# Patient Record
Sex: Female | Born: 1940 | Race: White | Hispanic: No | Marital: Married | Smoking: Current some day smoker
Health system: Southern US, Community
[De-identification: ages and names within clinical notes are randomized; demographics above are authoritative.]

## PROBLEM LIST (undated history)

## (undated) DIAGNOSIS — C801 Malignant (primary) neoplasm, unspecified: Secondary | ICD-10-CM

## (undated) DIAGNOSIS — M1990 Unspecified osteoarthritis, unspecified site: Secondary | ICD-10-CM

## (undated) DIAGNOSIS — T7840XA Allergy, unspecified, initial encounter: Secondary | ICD-10-CM

## (undated) HISTORY — DX: Allergy, unspecified, initial encounter: T78.40XA

## (undated) HISTORY — DX: Malignant (primary) neoplasm, unspecified: C80.1

---

## 1983-10-08 DIAGNOSIS — C801 Malignant (primary) neoplasm, unspecified: Secondary | ICD-10-CM

## 1983-10-08 HISTORY — DX: Malignant (primary) neoplasm, unspecified: C80.1

## 2007-10-08 HISTORY — PX: INGUINAL HERNIA REPAIR: SUR1180

## 2010-10-07 HISTORY — PX: CATARACT EXTRACTION W/ INTRAOCULAR LENS  IMPLANT, BILATERAL: SHX1307

## 2010-12-28 ENCOUNTER — Ambulatory Visit: Payer: Self-pay | Admitting: Internal Medicine

## 2012-04-20 ENCOUNTER — Ambulatory Visit (AMBULATORY_SURGERY_CENTER): Payer: Medicare Other | Admitting: *Deleted

## 2012-04-20 VITALS — Ht 67.0 in | Wt 160.0 lb

## 2012-04-20 DIAGNOSIS — Z1211 Encounter for screening for malignant neoplasm of colon: Secondary | ICD-10-CM

## 2012-04-20 MED ORDER — MOVIPREP 100 G PO SOLR: ORAL | Status: DC

## 2012-04-28 ENCOUNTER — Encounter: Payer: Self-pay | Admitting: Internal Medicine

## 2012-04-28 ENCOUNTER — Ambulatory Visit (AMBULATORY_SURGERY_CENTER): Payer: Medicare Other | Admitting: Internal Medicine

## 2012-04-28 VITALS — BP 151/87 | HR 74 | Temp 96.2°F | Resp 27 | Ht 67.0 in | Wt 160.0 lb

## 2012-04-28 DIAGNOSIS — Z1211 Encounter for screening for malignant neoplasm of colon: Secondary | ICD-10-CM

## 2012-04-28 DIAGNOSIS — Z86010 Personal history of colonic polyps: Secondary | ICD-10-CM

## 2012-04-28 DIAGNOSIS — K5730 Diverticulosis of large intestine without perforation or abscess without bleeding: Secondary | ICD-10-CM

## 2012-04-28 MED ORDER — SODIUM CHLORIDE 0.9 % IV SOLN: 500.0000 mL | INTRAVENOUS | Status: DC

## 2012-04-28 NOTE — [facility] Notes (Signed)
Patient did not experience any of the following events: a burn prior to discharge; a fall within the facility; wrong site/side/patient/procedure/implant event; or a hospital transfer or hospital admission upon discharge from the facility. (G8907) Patient did not have preoperative order for IV antibiotic SSI prophylaxis. (G8918)  

## 2012-04-28 NOTE — [facility] Notes (Signed)
The pt tolerated the colonoscopy very well. Maw   

## 2012-04-28 NOTE — Patient Instructions (Addendum)
No polyps today! You do have diverticulosis. Please read the information provided.  Thank you for choosing me and [facility].  Iva Boop, MD, [facility]  Discharge instructions given with verbal understanding. Handouts on diverticulosis and a high fiber diet. Resume previous medications. YOU HAD AN ENDOSCOPIC PROCEDURE TODAY AT THE [facility]: Refer to the procedure report that was given to you for any specific questions about what was found during the examination.  If the procedure report does not answer your questions, please call your gastroenterologist to clarify.  If you requested that your care partner not be given the details of your procedure findings, then the procedure report has been included in a sealed envelope for you to review at your convenience later.  YOU SHOULD EXPECT: Some feelings of bloating in the abdomen. Passage of more gas than usual.  Walking can help get rid of the air that was put into your GI tract during the procedure and reduce the bloating. If you had a lower endoscopy (such as a colonoscopy or flexible sigmoidoscopy) you may notice spotting of blood in your stool or on the toilet paper. If you underwent a bowel prep for your procedure, then you may not have a normal bowel movement for a few days.  DIET: Your first meal following the procedure should be a light meal and then it is ok to progress to your normal diet.  A half-sandwich or bowl of soup is an example of a good first meal.  Heavy or fried foods are harder to digest and may make you feel nauseous or bloated.  Likewise meals heavy in dairy and vegetables can cause extra gas to form and this can also increase the bloating.  Drink plenty of fluids but you should avoid alcoholic beverages for 24 hours.  ACTIVITY: Your care partner should take you home directly after the procedure.  You should plan to take it easy, moving slowly for the rest of the day.  You can resume normal activity  the day after the procedure however you should NOT DRIVE or use heavy machinery for 24 hours (because of the sedation medicines used during the test).    SYMPTOMS TO REPORT IMMEDIATELY: A gastroenterologist can be reached at any hour.  During normal business hours, 8:30 AM to 5:00 PM Monday through Friday, call [phone].  After hours and on weekends, please call the GI answering service at [phone] who will take a message and have the physician on call contact you.   Following lower endoscopy (colonoscopy or flexible sigmoidoscopy):  Excessive amounts of blood in the stool  Significant tenderness or worsening of abdominal pains  Swelling of the abdomen that is new, acute  Fever of 100F or higher  FOLLOW UP: If any biopsies were taken you will be contacted by phone or by letter within the next 1-3 weeks.  Call your gastroenterologist if you have not heard about the biopsies in 3 weeks.  Our staff will call the home number listed on your records the next business day following your procedure to check on you and address any questions or concerns that you may have at that time regarding the information given to you following your procedure. This is a courtesy call and so if there is no answer at the home number and we have not heard from you through the emergency physician on call, we will assume that you have returned to your regular daily activities without incident.  SIGNATURES/CONFIDENTIALITY: You and/or your  care partner have signed paperwork which will be entered into your electronic medical record.  These signatures attest to the fact that that the information above on your After Visit Summary has been reviewed and is understood.  Full responsibility of the confidentiality of this discharge information lies with you and/or your care-partner.

## 2012-04-28 NOTE — Op Note (Signed)
[facility] [address] [place], [place]  [address]  COLONOSCOPY PROCEDURE REPORT  PATIENT:  Laurie, Drake  MR#:  [id number] BIRTHDATE:  25-Jun-1941, 70 yrs. old  GENDER:  female ENDOSCOPIST:  Iva Boop, MD, [facility]  PROCEDURE DATE:  04/28/2012 PROCEDURE:  Higher-risk screening colonoscopy G0105  ASA CLASS:  Class II INDICATIONS:  surveillance and high-risk screening, history of polyps rectal polyp destroyed 2001 MEDICATIONS:   These medications were titrated to patient response per physician's verbal order, MAC sedation, administered by CRNA, propofol (Diprivan) 400 mg IV  DESCRIPTION OF PROCEDURE:   After the risks benefits and alternatives of the procedure were thoroughly explained, informed consent was obtained.  Digital rectal exam was performed and revealed no abnormalities.   The LB [id number] endoscope was introduced through the anus and advanced to the cecum, which was identified by both the appendix and ileocecal valve, without limitations.  The quality of the prep was good, using MoviPrep. The instrument was then slowly withdrawn as the colon was fully examined. <<PROCEDUREIMAGES>>  FINDINGS:  Severe diverticulosis was found in the sigmoid colon. Mild diverticulosis was found in the right colon.  This was otherwise a normal examination of the colon. Includes right colon retroflexion.   Retroflexed views in the rectum revealed no abnormalities.    The time to cecum = 5:27 minutes. The scope was then withdrawn in 9:06 minutes from the cecum and the procedure completed. COMPLICATIONS:  None ENDOSCOPIC IMPRESSION: 1) Severe diverticulosis in the sigmoid colon 2) Mild diverticulosis in the right colon 3) Otherwise normal examination, good prep  REPEAT EXAM:  In 10 year(s) for routine screening colonoscopy.  Iva Boop, MD, [facility]  CC:  The Patient and J. Jethro Poling, MD  n. Rosalie Doctor:   Iva Boop at 04/28/2012 12:38 PM  Darel Hong, [phone]

## 2012-04-29 ENCOUNTER — Telehealth: Payer: Self-pay | Admitting: *Deleted

## 2012-04-29 NOTE — Telephone Encounter (Signed)
  Follow up Call-  Call back number 04/28/2012  Post procedure Call Back phone  # [phone]  Permission to leave phone message Yes    [facility]

## 2013-10-18 ENCOUNTER — Encounter (HOSPITAL_COMMUNITY): Payer: Self-pay | Admitting: *Deleted

## 2013-10-18 ENCOUNTER — Other Ambulatory Visit: Payer: Self-pay | Admitting: Orthopedic Surgery

## 2013-10-18 NOTE — [facility] Notes (Signed)
Left Orson Slick message regarding rash after laser surgery in 08/2013 with Dr Manning Charity.  No info from them without medical release.  Dr Baltazar Najjar ( Dermatology) in [facility]- rash that was cultured around Thanksgiving that was MRSA.  On 10/12/13 boil developed on back of leg has been on the generic for Bactrim since 10/12/13.  Have requested last office visit note form Dr Baltazar Najjar.

## 2013-10-18 NOTE — [facility] Notes (Signed)
Completed allergies, medications and history .  [place] in [place] for clarifications of medication dosages.  [phone].  Called office of Dr Manning Charity [phone].  [place] [facility] regarding laser surgery done and chest and development of rash. Patient will need to sign Medical Release Form.  Called office of Dr Baltazar Najjar in [facility] [phone]) for recent records of rash and culture.  Office to send via fax- last office visit note.

## 2013-10-19 ENCOUNTER — Other Ambulatory Visit: Payer: Self-pay | Admitting: Orthopedic Surgery

## 2013-10-19 NOTE — H&P (Signed)
[name] is an 73 y.o. female.   Chief Complaint:  Back and left leg pain HPI: The patient is a 73 year old female who presents today for follow up of their back. The patient is being followed for their left-sided back pain. They are now 1 week(s) out from when symptoms began. Symptoms reported today include: pain, weakness (left leg), numbness (left leg) and leg pain (left). The following medication has been used for pain control: Norco and Prednisone and Flexeril. The patient reports their current pain level to be severe. The patient presents today following MRI.  She is taking the Dosepak. She is still bound to a wheelchair. She still reports numbness down the L4 nerve root distribution, medial tibia, anterior thigh. Unable to stand with weakness.  Past Medical History  Diagnosis Date  . Allergy     cat and dog dander and seasonal  . Cancer 1985    melanoma on left foot  . Arthritis     Past Surgical History  Procedure Laterality Date  . Cataract extraction w/ intraocular lens  implant, bilateral  2012  . Inguinal hernia repair  2009    right    Family History  Problem Relation Age of Onset  . Colon cancer Mother 71  . Colon cancer Maternal Aunt   . Esophageal cancer Neg Hx   . Rectal cancer Neg Hx   . Stomach cancer Neg Hx    Social History:  reports that she has been smoking Cigarettes.  She has a 1 pack-year smoking history. She has never used smokeless tobacco. She reports that she drinks about 6.0 ounces of alcohol per week. She reports that she does not use illicit drugs.  Allergies: No Known Allergies   (Not in a hospital admission)  No results found for this or any previous visit (from the past 48 hour(s)). No results found.  Review of Systems  Constitutional: Negative.   HENT: Negative.   Eyes: Negative.   Respiratory: Negative.   Cardiovascular: Negative.   Gastrointestinal: Negative.   Genitourinary: Negative.   Musculoskeletal: Positive for  back pain.  Skin: Positive for rash.  Neurological: Positive for sensory change and focal weakness.  Psychiatric/Behavioral: Negative.     There were no vitals taken for this visit. Physical Exam  Constitutional: She is oriented to person, place, and time. She appears well-developed and well-nourished. She appears distressed.  HENT:  Head: Normocephalic and atraumatic.  Eyes: Conjunctivae and EOM are normal. Pupils are equal, round, and reactive to light.  Neck: Normal range of motion. Neck supple.  Cardiovascular: Normal rate and regular rhythm.   Respiratory: Effort normal and breath sounds normal.  GI: Soft. Bowel sounds are normal.  Musculoskeletal:  On exam today she is in a wheelchair. Moderate distress. Mood and affect are appropriate She has knee replacement on the left. Right decreased sensation L4 dermatome. Quad weakness is noted. Femoral stretch is markedly positive.  Neurological: She is alert and oriented to person, place, and time. She has normal reflexes.  Skin: Skin is warm and dry.  Psychiatric: She has a normal mood and affect.    MRI was reviewed. Large disc herniation extruded either from 3-4 or 4-5. It is behind the vertebral body at 4 compression the 4 root. There is a disc herniation at 4-5 into the lateral recess.  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  We discussed options. Given her  significant neurocompression lesion, significant disability, dermatomal dysesthesias, myotomal weakness it would be wise to consider decompression at 3-4 and 4-5. Capsular extruded fragment to evaluate 3-4.    I had an extensive discussion of the risks and benefits of lumbar decompression with the patient including bleeding, infection, damage to neurovascular structures, epidural fibrosis, CSF leakage requiring repair. We also discussed increase in  pain, adjacent segment disease, recurrent disc herniation, need for future surgery including repeat decompression and/or fusion. We also discussed risks of postoperative hematoma, paralysis, anesthetic complications including DVT, PE, death, cardiopulmonary dysfunction. In addition, the perioperative and postoperative courses were discussed in detail including the rehabilitative time and return to functional activity and work. I provided the patient with an illustrated handout and utilized the appropriate surgical models.  She is otherwise doing well. No chest, pain, or shortness or breath. Recently, however, she was treated back in November for MRSA infection. She was seen by a dermatologist. It was cultured. She was initially put on a different antibiotic. That was treated to resolution. We will obtain that culture.  A week or so ago she thought there was an area coming up in the back of her thigh. She was placed on Bactrim. It never erupted. It is not painful. There was no drainage. She was taking that still as a prophylactic measure. She was also given the mupirocin.  We stood her up from the wheelchair and looked at the back of her thigh. She has a healed lesion in the back of her thigh. It is not indurated. There is no drainage. I can press it without pain or sensitivity. I do not feel she has an active infection at this point in time. However, we do need to continue prophylaxis. I would use her mupirocin.  We will schedule this urgently in the operating room given the neurologic deficit. Also use preoperative Vancomycin and we may consider some postoperative MRSA prophylaxis as well. They understand. Lesions noted. Tobacco cessation is a requirement as well. Discussed this with her husband. We reviewed the MRI and the x rays in extensive detail. Again, she is wheelchair bound. We discussed the possibility that this may not recovery. She may have a permanent neurological  deficit. At this point, again, decompression will at least given the opportunity to recover.  Plan microlumbar decompression L3-4, L4-5  Lazaria Schaben M. for Dr. Tonita Cong 10/19/2013, 4:40 PM

## 2013-10-19 NOTE — [facility] Notes (Signed)
Called patient and obtained the exact physician she saw on 10/12/13 and where seen.  Patient was seen by Cecille Rubin Hvoxdovic at [facility] in [facility].  [phone].  Called the office and requested the last office visit note from 10/12/2013.  They are to send.  Spoke with Orson Slick at Dr Tonita Cong office and she stated Dr Tonita Cong and PA aware of hx of MRSA and that they were trying to obtain information from physician so they would know how to treat patient.  I made her aware of information I had from [facility] and faxed to her.

## 2013-10-19 NOTE — [facility] Notes (Signed)
Clarified soap received from pharmacy was hibiclens.  Patient verified over phone.

## 2013-10-19 NOTE — [facility] Notes (Signed)
Received from [facility] office visit note dated 10/12/13. Placed on chart and faxed to Orson Slick, surgery scheduled at office of Dr Tonita Cong.

## 2013-10-20 ENCOUNTER — Encounter (HOSPITAL_COMMUNITY): Payer: Medicare Other | Admitting: Anesthesiology

## 2013-10-20 ENCOUNTER — Encounter (HOSPITAL_COMMUNITY): Payer: Self-pay | Admitting: *Deleted

## 2013-10-20 ENCOUNTER — Ambulatory Visit (HOSPITAL_COMMUNITY): Payer: Medicare Other

## 2013-10-20 ENCOUNTER — Ambulatory Visit (HOSPITAL_COMMUNITY): Payer: Medicare Other | Admitting: Anesthesiology

## 2013-10-20 ENCOUNTER — Ambulatory Visit (HOSPITAL_COMMUNITY)
Admission: RE | Admit: 2013-10-20 | Discharge: 2013-10-21 | Disposition: A | Discharge status: Home / Self Care | Payer: Medicare Other | Source: Ambulatory Visit | Attending: Specialist | Admitting: Specialist

## 2013-10-20 ENCOUNTER — Encounter (HOSPITAL_COMMUNITY)
Admission: RE | Disposition: A | Discharge status: Home / Self Care | Payer: Self-pay | Source: Ambulatory Visit | Attending: Specialist

## 2013-10-20 DIAGNOSIS — M129 Arthropathy, unspecified: Secondary | ICD-10-CM | POA: Insufficient documentation

## 2013-10-20 DIAGNOSIS — M5126 Other intervertebral disc displacement, lumbar region: Secondary | ICD-10-CM | POA: Diagnosis present

## 2013-10-20 DIAGNOSIS — Z79899 Other long term (current) drug therapy: Secondary | ICD-10-CM | POA: Insufficient documentation

## 2013-10-20 DIAGNOSIS — M48062 Spinal stenosis, lumbar region with neurogenic claudication: Secondary | ICD-10-CM

## 2013-10-20 DIAGNOSIS — M5380 Other specified dorsopathies, site unspecified: Secondary | ICD-10-CM | POA: Insufficient documentation

## 2013-10-20 DIAGNOSIS — Z85820 Personal history of malignant melanoma of skin: Secondary | ICD-10-CM | POA: Insufficient documentation

## 2013-10-20 DIAGNOSIS — Z9109 Other allergy status, other than to drugs and biological substances: Secondary | ICD-10-CM | POA: Insufficient documentation

## 2013-10-20 DIAGNOSIS — Z96659 Presence of unspecified artificial knee joint: Secondary | ICD-10-CM | POA: Insufficient documentation

## 2013-10-20 DIAGNOSIS — F17200 Nicotine dependence, unspecified, uncomplicated: Secondary | ICD-10-CM | POA: Insufficient documentation

## 2013-10-20 HISTORY — DX: Unspecified osteoarthritis, unspecified site: M19.90

## 2013-10-20 HISTORY — PX: LUMBAR LAMINECTOMY/DECOMPRESSION MICRODISCECTOMY: SHX5026

## 2013-10-20 LAB — CBC
HCT: 39.4 % (ref 36.0–46.0)
Hemoglobin: 13.7 g/dL (ref 12.0–15.0)
MCH: 32.4 pg (ref 26.0–34.0)
MCHC: 34.8 g/dL (ref 30.0–36.0)
MCV: 93.1 fL (ref 78.0–100.0)
PLATELETS: 329 10*3/uL (ref 150–400)
RBC: 4.23 MIL/uL (ref 3.87–5.11)
RDW: 13 % (ref 11.5–15.5)
WBC: 12.6 10*3/uL — ABNORMAL HIGH (ref 4.0–10.5)

## 2013-10-20 LAB — BASIC METABOLIC PANEL
BUN: 18 mg/dL (ref 6–23)
CO2: 27 mEq/L (ref 19–32)
Calcium: 8.9 mg/dL (ref 8.4–10.5)
Chloride: 93 mEq/L — ABNORMAL LOW (ref 96–112)
Creatinine, Ser: 1 mg/dL (ref 0.50–1.10)
GFR calc Af Amer: 65 mL/min — ABNORMAL LOW (ref >90)
GFR, EST NON AFRICAN AMERICAN: 56 mL/min — AB (ref >90)
Glucose, Bld: 96 mg/dL (ref 70–99)
Potassium: 4.7 mEq/L (ref 3.7–5.3)
SODIUM: 130 meq/L — AB (ref 137–147)

## 2013-10-20 SURGERY — LUMBAR LAMINECTOMY/DECOMPRESSION MICRODISCECTOMY 2 LEVELS
Anesthesia: General | Site: Back

## 2013-10-20 MED ORDER — MENTHOL 3 MG MT LOZG
1.0000 | LOZENGE | OROMUCOSAL | Status: DC | PRN
  Filled 2013-10-20: qty 9

## 2013-10-20 MED ORDER — DOCUSATE SODIUM 100 MG PO CAPS: 100.0000 mg | ORAL_CAPSULE | Freq: Two times a day (BID) | ORAL | Status: AC

## 2013-10-20 MED ORDER — METHOCARBAMOL 500 MG PO TABS: 500.0000 mg | ORAL_TABLET | Freq: Four times a day (QID) | ORAL | Status: DC | PRN

## 2013-10-20 MED ORDER — CONJ ESTROG-MEDROXYPROGEST ACE 0.3-1.5 MG PO TABS: 1.0000 | ORAL_TABLET | Freq: Every day | ORAL | Status: DC

## 2013-10-20 MED ORDER — MIDAZOLAM HCL 5 MG/5ML IJ SOLN
INTRAMUSCULAR | Status: DC | PRN
  Administered 2013-10-20: 1 mg via INTRAVENOUS

## 2013-10-20 MED ORDER — OXYCODONE-ACETAMINOPHEN 5-325 MG PO TABS
1.0000 | ORAL_TABLET | ORAL | Status: DC | PRN
  Administered 2013-10-20: 2 via ORAL
  Filled 2013-10-20: qty 2

## 2013-10-20 MED ORDER — ACETAMINOPHEN 650 MG RE SUPP: 650.0000 mg | RECTAL | Status: DC | PRN

## 2013-10-20 MED ORDER — SUFENTANIL CITRATE 50 MCG/ML IV SOLN
INTRAVENOUS | Status: DC | PRN
  Administered 2013-10-20 (×2): 10 ug via INTRAVENOUS
  Administered 2013-10-20 (×2): 5 ug via INTRAVENOUS
  Administered 2013-10-20: 10 ug via INTRAVENOUS

## 2013-10-20 MED ORDER — LACTATED RINGERS IV SOLN: INTRAVENOUS | Status: DC

## 2013-10-20 MED ORDER — SUCCINYLCHOLINE CHLORIDE 20 MG/ML IJ SOLN
INTRAMUSCULAR | Status: DC | PRN
  Administered 2013-10-20: 50 mg via INTRAVENOUS

## 2013-10-20 MED ORDER — PROPOFOL 10 MG/ML IV BOLUS
INTRAVENOUS | Status: AC
  Filled 2013-10-20: qty 20

## 2013-10-20 MED ORDER — ACETAMINOPHEN 325 MG PO TABS: 650.0000 mg | ORAL_TABLET | ORAL | Status: DC | PRN

## 2013-10-20 MED ORDER — SODIUM CHLORIDE 0.9 % IJ SOLN
INTRAMUSCULAR | Status: AC
  Filled 2013-10-20: qty 10

## 2013-10-20 MED ORDER — VANCOMYCIN HCL IN DEXTROSE 1-5 GM/200ML-% IV SOLN
INTRAVENOUS | Status: AC
  Filled 2013-10-20: qty 200

## 2013-10-20 MED ORDER — ONDANSETRON HCL 4 MG/2ML IJ SOLN: 4.0000 mg | INTRAMUSCULAR | Status: DC | PRN

## 2013-10-20 MED ORDER — DOCUSATE SODIUM 100 MG PO CAPS
100.0000 mg | ORAL_CAPSULE | Freq: Two times a day (BID) | ORAL | Status: DC
  Administered 2013-10-20 – 2013-10-21 (×2): 100 mg via ORAL

## 2013-10-20 MED ORDER — THROMBIN 5000 UNITS EX SOLR
CUTANEOUS | Status: AC
  Filled 2013-10-20: qty 5000

## 2013-10-20 MED ORDER — SODIUM CHLORIDE 0.9 % IJ SOLN: 3.0000 mL | INTRAMUSCULAR | Status: DC | PRN

## 2013-10-20 MED ORDER — SODIUM CHLORIDE 0.9 % IJ SOLN
3.0000 mL | Freq: Two times a day (BID) | INTRAMUSCULAR | Status: DC
  Administered 2013-10-21: 3 mL via INTRAVENOUS

## 2013-10-20 MED ORDER — SODIUM CHLORIDE 0.9 % IR SOLN
Status: DC | PRN
  Administered 2013-10-20: 12:00:00

## 2013-10-20 MED ORDER — DEXAMETHASONE SODIUM PHOSPHATE 10 MG/ML IJ SOLN
INTRAMUSCULAR | Status: AC
  Filled 2013-10-20: qty 1

## 2013-10-20 MED ORDER — VANCOMYCIN HCL IN DEXTROSE 1-5 GM/200ML-% IV SOLN
1000.0000 mg | INTRAVENOUS | Status: AC
  Administered 2013-10-20: 1000 mg via INTRAVENOUS

## 2013-10-20 MED ORDER — KETAMINE HCL 10 MG/ML IJ SOLN
INTRAMUSCULAR | Status: AC
  Filled 2013-10-20: qty 1

## 2013-10-20 MED ORDER — HYDROMORPHONE HCL PF 1 MG/ML IJ SOLN
0.2500 mg | INTRAMUSCULAR | Status: DC | PRN
  Administered 2013-10-20: 0.5 mg via INTRAVENOUS

## 2013-10-20 MED ORDER — BUPIVACAINE-EPINEPHRINE (PF) 0.5% -1:200000 IJ SOLN
INTRAMUSCULAR | Status: DC | PRN
  Administered 2013-10-20: 4 mL

## 2013-10-20 MED ORDER — PHENOL 1.4 % MT LIQD: 1.0000 | OROMUCOSAL | Status: DC | PRN

## 2013-10-20 MED ORDER — KETAMINE HCL 10 MG/ML IJ SOLN
INTRAMUSCULAR | Status: DC | PRN
  Administered 2013-10-20: 20 mg via INTRAVENOUS
  Administered 2013-10-20 (×3): 10 mg via INTRAVENOUS

## 2013-10-20 MED ORDER — MUPIROCIN 2 % EX OINT
1.0000 "application " | TOPICAL_OINTMENT | Freq: Two times a day (BID) | CUTANEOUS | Status: DC
  Administered 2013-10-20 – 2013-10-21 (×2): 1 via NASAL
  Filled 2013-10-20: qty 22

## 2013-10-20 MED ORDER — HYDROMORPHONE HCL PF 1 MG/ML IJ SOLN
INTRAMUSCULAR | Status: AC
  Filled 2013-10-20: qty 1

## 2013-10-20 MED ORDER — CHLORHEXIDINE GLUCONATE 4 % EX LIQD: 60.0000 mL | Freq: Once | CUTANEOUS | Status: DC

## 2013-10-20 MED ORDER — MIDAZOLAM HCL 2 MG/2ML IJ SOLN
INTRAMUSCULAR | Status: AC
  Filled 2013-10-20: qty 2

## 2013-10-20 MED ORDER — SODIUM CHLORIDE 0.45 % IV SOLN
INTRAVENOUS | Status: DC
  Administered 2013-10-21: 05:00:00 via INTRAVENOUS

## 2013-10-20 MED ORDER — VANCOMYCIN HCL IN DEXTROSE 1-5 GM/200ML-% IV SOLN
1000.0000 mg | Freq: Two times a day (BID) | INTRAVENOUS | Status: AC
  Administered 2013-10-20 – 2013-10-21 (×2): 1000 mg via INTRAVENOUS
  Filled 2013-10-20 (×2): qty 200

## 2013-10-20 MED ORDER — CISATRACURIUM BESYLATE 20 MG/10ML IV SOLN
INTRAVENOUS | Status: AC
  Filled 2013-10-20: qty 10

## 2013-10-20 MED ORDER — METHOCARBAMOL 500 MG PO TABS: 500.0000 mg | ORAL_TABLET | Freq: Three times a day (TID) | ORAL | Status: AC

## 2013-10-20 MED ORDER — BUPIVACAINE-EPINEPHRINE PF 0.5-1:200000 % IJ SOLN
INTRAMUSCULAR | Status: AC
  Filled 2013-10-20: qty 30

## 2013-10-20 MED ORDER — PROPOFOL 10 MG/ML IV BOLUS
INTRAVENOUS | Status: DC | PRN
  Administered 2013-10-20: 80 mg via INTRAVENOUS

## 2013-10-20 MED ORDER — LIDOCAINE HCL (CARDIAC) 20 MG/ML IV SOLN
INTRAVENOUS | Status: AC
  Filled 2013-10-20: qty 5

## 2013-10-20 MED ORDER — CHLORHEXIDINE GLUCONATE CLOTH 2 % EX PADS: 6.0000 | MEDICATED_PAD | Freq: Once | CUTANEOUS | Status: DC

## 2013-10-20 MED ORDER — CISATRACURIUM BESYLATE (PF) 10 MG/5ML IV SOLN
INTRAVENOUS | Status: DC | PRN
  Administered 2013-10-20: 2 mg via INTRAVENOUS
  Administered 2013-10-20: 6 mg via INTRAVENOUS

## 2013-10-20 MED ORDER — NEOSTIGMINE METHYLSULFATE 1 MG/ML IJ SOLN
INTRAMUSCULAR | Status: AC
  Filled 2013-10-20: qty 10

## 2013-10-20 MED ORDER — LIDOCAINE HCL (CARDIAC) 20 MG/ML IV SOLN
INTRAVENOUS | Status: DC | PRN
  Administered 2013-10-20: 50 mg via INTRAVENOUS

## 2013-10-20 MED ORDER — EPHEDRINE SULFATE 50 MG/ML IJ SOLN
INTRAMUSCULAR | Status: DC | PRN
  Administered 2013-10-20 (×3): 10 mg via INTRAVENOUS

## 2013-10-20 MED ORDER — HYDROCODONE-ACETAMINOPHEN 7.5-325 MG PO TABS: 1.0000 | ORAL_TABLET | ORAL | Status: AC | PRN

## 2013-10-20 MED ORDER — HYDROMORPHONE HCL PF 1 MG/ML IJ SOLN: 0.5000 mg | INTRAMUSCULAR | Status: DC | PRN

## 2013-10-20 MED ORDER — LACTATED RINGERS IV SOLN
INTRAVENOUS | Status: DC
  Administered 2013-10-20: 1000 mL via INTRAVENOUS
  Administered 2013-10-20: 12:00:00 via INTRAVENOUS

## 2013-10-20 MED ORDER — ONDANSETRON HCL 4 MG/2ML IJ SOLN
INTRAMUSCULAR | Status: AC
  Filled 2013-10-20: qty 2

## 2013-10-20 MED ORDER — THROMBIN 5000 UNITS EX SOLR
CUTANEOUS | Status: DC | PRN
  Administered 2013-10-20 (×2): 5000 [IU] via TOPICAL

## 2013-10-20 MED ORDER — METHOCARBAMOL 100 MG/ML IJ SOLN
500.0000 mg | Freq: Four times a day (QID) | INTRAVENOUS | Status: DC | PRN
  Administered 2013-10-20: 19:00:00 500 mg via INTRAVENOUS
  Filled 2013-10-20: qty 5

## 2013-10-20 MED ORDER — NEOSTIGMINE METHYLSULFATE 1 MG/ML IJ SOLN
INTRAMUSCULAR | Status: DC | PRN
  Administered 2013-10-20: 4 mg via INTRAVENOUS

## 2013-10-20 MED ORDER — SUFENTANIL CITRATE 50 MCG/ML IV SOLN
INTRAVENOUS | Status: AC
  Filled 2013-10-20: qty 1

## 2013-10-20 MED ORDER — SULFAMETHOXAZOLE-TRIMETHOPRIM 400-80 MG PO TABS
1.0000 | ORAL_TABLET | Freq: Two times a day (BID) | ORAL | Status: DC
Start: 2013-10-20 — End: 2013-10-21
  Administered 2013-10-20 – 2013-10-21 (×2): 1 via ORAL
  Filled 2013-10-20 (×4): qty 1

## 2013-10-20 MED ORDER — HYDROCODONE-ACETAMINOPHEN 5-325 MG PO TABS
1.0000 | ORAL_TABLET | ORAL | Status: DC | PRN
  Administered 2013-10-20 – 2013-10-21 (×3): 2 via ORAL
  Filled 2013-10-20 (×3): qty 2

## 2013-10-20 MED ORDER — SODIUM CHLORIDE 0.9 % IV SOLN: 250.0000 mL | INTRAVENOUS | Status: DC

## 2013-10-20 MED ORDER — GLYCOPYRROLATE 0.2 MG/ML IJ SOLN
INTRAMUSCULAR | Status: DC | PRN
  Administered 2013-10-20: .6 mg via INTRAVENOUS

## 2013-10-20 MED ORDER — ONDANSETRON HCL 4 MG/2ML IJ SOLN
INTRAMUSCULAR | Status: DC | PRN
  Administered 2013-10-20: 4 mg via INTRAVENOUS

## 2013-10-20 MED ORDER — EPHEDRINE SULFATE 50 MG/ML IJ SOLN
INTRAMUSCULAR | Status: AC
  Filled 2013-10-20: qty 1

## 2013-10-20 MED ORDER — GLYCOPYRROLATE 0.2 MG/ML IJ SOLN
INTRAMUSCULAR | Status: AC
  Filled 2013-10-20: qty 3

## 2013-10-20 MED ORDER — DEXAMETHASONE SODIUM PHOSPHATE 10 MG/ML IJ SOLN
INTRAMUSCULAR | Status: DC | PRN
  Administered 2013-10-20: 10 mg via INTRAVENOUS

## 2013-10-20 SURGICAL SUPPLY — 46 items
BAG ZIPLOCK 12X15 (MISCELLANEOUS) ×3 IMPLANT
BENZOIN TINCTURE PRP APPL 2/3 (GAUZE/BANDAGES/DRESSINGS) ×3 IMPLANT
CHLORAPREP W/TINT 26ML (MISCELLANEOUS) IMPLANT
CLEANER TIP ELECTROSURG 2X2 (MISCELLANEOUS) ×3 IMPLANT
CLOSURE WOUND 1/2 X4 (GAUZE/BANDAGES/DRESSINGS) ×1
CLOTH 2% CHLOROHEXIDINE 3PK (PERSONAL CARE ITEMS) ×3 IMPLANT
DECANTER SPIKE VIAL GLASS SM (MISCELLANEOUS) ×3 IMPLANT
DRAPE MICROSCOPE LEICA (MISCELLANEOUS) ×3 IMPLANT
DRAPE POUCH INSTRU U-SHP 10X18 (DRAPES) ×3 IMPLANT
DRAPE SURG 17X11 SM STRL (DRAPES) ×3 IMPLANT
DRAPE UTILITY XL STRL (DRAPES) ×3 IMPLANT
DRSG AQUACEL AG ADV 3.5X 4 (GAUZE/BANDAGES/DRESSINGS) IMPLANT
DRSG AQUACEL AG ADV 3.5X 6 (GAUZE/BANDAGES/DRESSINGS) ×3 IMPLANT
DURAPREP 26ML APPLICATOR (WOUND CARE) ×3 IMPLANT
DURASEAL SPINE SEALANT 3ML (MISCELLANEOUS) ×3 IMPLANT
ELECT REM PT RETURN 9FT ADLT (ELECTROSURGICAL) ×3
ELECTRODE REM PT RTRN 9FT ADLT (ELECTROSURGICAL) ×1 IMPLANT
GLOVE BIOGEL PI IND STRL 7.5 (GLOVE) ×1 IMPLANT
GLOVE BIOGEL PI INDICATOR 7.5 (GLOVE) ×2
GLOVE SURG SS PI 7.5 STRL IVOR (GLOVE) ×3 IMPLANT
GLOVE SURG SS PI 8.0 STRL IVOR (GLOVE) ×6 IMPLANT
GOWN STRL REUS W/TWL XL LVL3 (GOWN DISPOSABLE) ×6 IMPLANT
IV CATH 14GX2 1/4 (CATHETERS) ×3 IMPLANT
KIT BASIN OR (CUSTOM PROCEDURE TRAY) ×3 IMPLANT
KIT POSITIONING SURG ANDREWS (MISCELLANEOUS) ×3 IMPLANT
MANIFOLD NEPTUNE II (INSTRUMENTS) ×3 IMPLANT
NEEDLE SPNL 18GX3.5 QUINCKE PK (NEEDLE) ×9 IMPLANT
PATTIES SURGICAL .5 X.5 (GAUZE/BANDAGES/DRESSINGS) IMPLANT
PATTIES SURGICAL .75X.75 (GAUZE/BANDAGES/DRESSINGS) IMPLANT
PATTIES SURGICAL 1X1 (DISPOSABLE) IMPLANT
SPONGE SURGIFOAM ABS GEL 100 (HEMOSTASIS) ×3 IMPLANT
STRIP CLOSURE SKIN 1/2X4 (GAUZE/BANDAGES/DRESSINGS) ×2 IMPLANT
SUT NURALON 4 0 TR CR/8 (SUTURE) ×3 IMPLANT
SUT PROLENE 3 0 PS 2 (SUTURE) ×3 IMPLANT
SUT VIC AB 1 CT1 27 (SUTURE)
SUT VIC AB 1 CT1 27XBRD ANTBC (SUTURE) IMPLANT
SUT VIC AB 1-0 CT2 27 (SUTURE) IMPLANT
SUT VIC AB 2-0 CT1 27 (SUTURE)
SUT VIC AB 2-0 CT1 TAPERPNT 27 (SUTURE) IMPLANT
SUT VIC AB 2-0 CT2 27 (SUTURE) IMPLANT
SYRINGE 10CC LL (SYRINGE) ×3 IMPLANT
TOWEL OR 17X26 10 PK STRL BLUE (TOWEL DISPOSABLE) ×3 IMPLANT
TOWEL OR NON WOVEN STRL DISP B (DISPOSABLE) ×3 IMPLANT
TRAY FOLEY CATH 14FRSI W/METER (CATHETERS) ×3 IMPLANT
TRAY LAMINECTOMY (CUSTOM PROCEDURE TRAY) ×3 IMPLANT
YANKAUER SUCT BULB TIP NO VENT (SUCTIONS) ×3 IMPLANT

## 2013-10-20 NOTE — Discharge Instructions (Signed)
Walk As Tolerated utilizing back precautions.  No bending, twisting, or lifting.  No driving for 2 weeks.   °Aquacel dressing may remain in place for 7 days. May shower with aquacel dressing in place. After 7 days, remove aquacel dressing and place gauze and tape dressing which should be kept clean and dry and changed daily. °See Dr. Beane in office in 10 to 14 days. Begin taking aspirin 81mg per day starting 4 days after your surgery if not allergic to aspirin or on another blood thinner. °Walk daily even outside. Use a cane or walker only if necessary. °Avoid sitting on soft sofas. ° °

## 2013-10-20 NOTE — Brief Op Note (Signed)
10/20/2013  12:39 PM  PATIENT:  Derrick Ravel  73 y.o. female  PRE-OPERATIVE DIAGNOSIS:  HNP L3-4  POST-OPERATIVE DIAGNOSIS:  HNP L3-4  PROCEDURE:  Procedure(s): MICROLUMBAR DECOMPRESSION MICRODISCECTOMY L3-4,L4-5 (N/A)  SURGEON:  Surgeon(s) and Role:    * Johnn Hai, MD - Primary  PHYSICIAN ASSISTANT:   ASSISTANTS: Bissell   ANESTHESIA:   general  EBL:  Total I/O In: 1000 [I.V.:1000] Out: 500 [Urine:450; Blood:50]  BLOOD ADMINISTERED:none  DRAINS: none   LOCAL MEDICATIONS USED:  MARCAINE     SPECIMEN:  No Specimen  DISPOSITION OF SPECIMEN:  N/A  COUNTS:  YES  TOURNIQUET:  * No tourniquets in log *  DICTATION: .Other Dictation: Dictation Number [id number]  PLAN OF CARE: Admit for overnight observation  PATIENT DISPOSITION:  PACU - hemodynamically stable.   Delay start of Pharmacological VTE agent (>24hrs) due to surgical blood loss or risk of bleeding: yes

## 2013-10-20 NOTE — Plan of Care (Signed)
Problem: Consults Goal: Diagnosis - Spinal Surgery Lumbar Laminectomy (Complex)     

## 2013-10-20 NOTE — Anesthesia Preprocedure Evaluation (Addendum)
Anesthesia Evaluation  Patient identified by MRN, date of birth, ID band Patient awake    Reviewed: Allergy & Precautions, H&P , NPO status , Patient's Chart, lab work & pertinent test results  Airway Mallampati: II TM Distance: >3 FB Neck ROM: full    Dental  (+) Caps and Dental Advisory Given Cap left upper front:   Pulmonary neg pulmonary ROS, Current Smoker,  breath sounds clear to auscultation  Pulmonary exam normal       Cardiovascular Exercise Tolerance: Good negative cardio ROS  Rhythm:regular Rate:Normal     Neuro/Psych negative neurological ROS  negative psych ROS   GI/Hepatic negative GI ROS, Neg liver ROS,   Endo/Other  negative endocrine ROS  Renal/GU negative Renal ROS  negative genitourinary   Musculoskeletal   Abdominal   Peds  Hematology negative hematology ROS (+)   Anesthesia Other Findings   Reproductive/Obstetrics negative OB ROS                          Anesthesia Physical Anesthesia Plan  ASA: II  Anesthesia Plan: General   Post-op Pain Management:    Induction: Intravenous  Airway Management Planned: Oral ETT  Additional Equipment:   Intra-op Plan:   Post-operative Plan: Extubation in OR  Informed Consent: I have reviewed the patients History and Physical, chart, labs and discussed the procedure including the risks, benefits and alternatives for the proposed anesthesia with the patient or authorized representative who has indicated his/her understanding and acceptance.   Dental Advisory Given  Plan Discussed with: CRNA and Surgeon  Anesthesia Plan Comments:         Anesthesia Quick Evaluation

## 2013-10-20 NOTE — Transfer of Care (Signed)
Immediate Anesthesia Transfer of Care Note  Patient: [name]  Procedure(s) Performed: Procedure(s): MICROLUMBAR DECOMPRESSION MICRODISCECTOMY L3-4,L4-5 (N/A)  Patient Location: PACU  Anesthesia Type:General  Level of Consciousness: awake and oriented  Airway & Oxygen Therapy: Patient Spontanous Breathing and Patient connected to face mask oxygen  Post-op Assessment: Report given to PACU RN and Post -op Vital signs reviewed and stable  Post vital signs: Reviewed and stable  Complications: No apparent anesthesia complications

## 2013-10-20 NOTE — Preoperative (Signed)
Beta Blockers   Reason not to administer Beta Blockers:Not Applicable 

## 2013-10-20 NOTE — Anesthesia Procedure Notes (Signed)
Procedure Name: Intubation Date/Time: 10/20/2013 11:07 AM Performed by: Danley Danker L Patient Re-evaluated:Patient Re-evaluated prior to inductionOxygen Delivery Method: Circle system utilized Preoxygenation: Pre-oxygenation with 100% oxygen Intubation Type: IV induction Ventilation: Mask ventilation without difficulty and Oral airway inserted - appropriate to patient size Laryngoscope Size: Sabra Heck and 2 Grade View: Grade II Tube type: Oral Tube size: 7.5 mm Number of attempts: 1 Airway Equipment and Method: Stylet Placement Confirmation: ETT inserted through vocal cords under direct vision,  breath sounds checked- equal and bilateral and positive ETCO2 Secured at: 21 cm Tube secured with: Tape Dental Injury: Teeth and Oropharynx as per pre-operative assessment

## 2013-10-20 NOTE — [facility] Notes (Signed)
Patient states that Dr. Tonita Cong is aware that she has had a recent boil on her leg which was positive for MRSA. He instructed her to use Mupirocin ointment.  Mickel Baas RN with infection control was notified that patient has MRSA and was placed on isolation.

## 2013-10-20 NOTE — Interval H&P Note (Signed)
History and Physical Interval Note:  10/20/2013 8:33 AM  [name]  has presented today for surgery, with the diagnosis of HNP L3-4  The various methods of treatment have been discussed with the patient and family. After consideration of risks, benefits and other options for treatment, the patient has consented to  Procedure(s): MICROLUMBAR DECOMPRESSION MICRODISCECTOMY L3-4,L4-5 (N/A) as a surgical intervention .  The patient's history has been reviewed, patient examined, no change in status, stable for surgery.  I have reviewed the patient's chart and labs.  Questions were answered to the patient's satisfaction.     Chloee Tena C

## 2013-10-20 NOTE — H&P (View-Only) (Signed)
[name] is an 72 y.o. female.   Chief Complaint:  Back and left leg pain HPI: The patient is a 72 year old female who presents today for follow up of their back. The patient is being followed for their left-sided back pain. They are now 1 week(s) out from when symptoms began. Symptoms reported today include: pain, weakness (left leg), numbness (left leg) and leg pain (left). The following medication has been used for pain control: Norco and Prednisone and Flexeril. The patient reports their current pain level to be severe. The patient presents today following MRI.  She is taking the Dosepak. She is still bound to a wheelchair. She still reports numbness down the L4 nerve root distribution, medial tibia, anterior thigh. Unable to stand with weakness.  Past Medical History  Diagnosis Date  . Allergy     cat and dog dander and seasonal  . Cancer 1985    melanoma on left foot  . Arthritis     Past Surgical History  Procedure Laterality Date  . Cataract extraction w/ intraocular lens  implant, bilateral  2012  . Inguinal hernia repair  2009    right    Family History  Problem Relation Age of Onset  . Colon cancer Mother 80  . Colon cancer Maternal Aunt   . Esophageal cancer Neg Hx   . Rectal cancer Neg Hx   . Stomach cancer Neg Hx    Social History:  reports that she has been smoking Cigarettes.  She has a 1 pack-year smoking history. She has never used smokeless tobacco. She reports that she drinks about 6.0 ounces of alcohol per week. She reports that she does not use illicit drugs.  Allergies: No Known Allergies   (Not in a hospital admission)  No results found for this or any previous visit (from the past 48 hour(s)). No results found.  Review of Systems  Constitutional: Negative.   HENT: Negative.   Eyes: Negative.   Respiratory: Negative.   Cardiovascular: Negative.   Gastrointestinal: Negative.   Genitourinary: Negative.   Musculoskeletal: Positive for  back pain.  Skin: Positive for rash.  Neurological: Positive for sensory change and focal weakness.  Psychiatric/Behavioral: Negative.     There were no vitals taken for this visit. Physical Exam  Constitutional: She is oriented to person, place, and time. She appears well-developed and well-nourished. She appears distressed.  HENT:  Head: Normocephalic and atraumatic.  Eyes: Conjunctivae and EOM are normal. Pupils are equal, round, and reactive to light.  Neck: Normal range of motion. Neck supple.  Cardiovascular: Normal rate and regular rhythm.   Respiratory: Effort normal and breath sounds normal.  GI: Soft. Bowel sounds are normal.  Musculoskeletal:  On exam today she is in a wheelchair. Moderate distress. Mood and affect are appropriate She has knee replacement on the left. Right decreased sensation L4 dermatome. Quad weakness is noted. Femoral stretch is markedly positive.  Neurological: She is alert and oriented to person, place, and time. She has normal reflexes.  Skin: Skin is warm and dry.  Psychiatric: She has a normal mood and affect.    MRI was reviewed. Large disc herniation extruded either from 3-4 or 4-5. It is behind the vertebral body at 4 compression the 4 root. There is a disc herniation at 4-5 into the lateral recess.  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  We discussed options. Given her   significant neurocompression lesion, significant disability, dermatomal dysesthesias, myotomal weakness it would be wise to consider decompression at 3-4 and 4-5. Capsular extruded fragment to evaluate 3-4.    I had an extensive discussion of the risks and benefits of lumbar decompression with the patient including bleeding, infection, damage to neurovascular structures, epidural fibrosis, CSF leakage requiring repair. We also discussed increase in  pain, adjacent segment disease, recurrent disc herniation, need for future surgery including repeat decompression and/or fusion. We also discussed risks of postoperative hematoma, paralysis, anesthetic complications including DVT, PE, death, cardiopulmonary dysfunction. In addition, the perioperative and postoperative courses were discussed in detail including the rehabilitative time and return to functional activity and work. I provided the patient with an illustrated handout and utilized the appropriate surgical models.  She is otherwise doing well. No chest, pain, or shortness or breath. Recently, however, she was treated back in November for MRSA infection. She was seen by a dermatologist. It was cultured. She was initially put on a different antibiotic. That was treated to resolution. We will obtain that culture.  A week or so ago she thought there was an area coming up in the back of her thigh. She was placed on Bactrim. It never erupted. It is not painful. There was no drainage. She was taking that still as a prophylactic measure. She was also given the mupirocin.  We stood her up from the wheelchair and looked at the back of her thigh. She has a healed lesion in the back of her thigh. It is not indurated. There is no drainage. I can press it without pain or sensitivity. I do not feel she has an active infection at this point in time. However, we do need to continue prophylaxis. I would use her mupirocin.  We will schedule this urgently in the operating room given the neurologic deficit. Also use preoperative Vancomycin and we may consider some postoperative MRSA prophylaxis as well. They understand. Lesions noted. Tobacco cessation is a requirement as well. Discussed this with her husband. We reviewed the MRI and the x rays in extensive detail. Again, she is wheelchair bound. We discussed the possibility that this may not recovery. She may have a permanent neurological  deficit. At this point, again, decompression will at least given the opportunity to recover.  Plan microlumbar decompression L3-4, L4-5  BISSELL, JACLYN M. for Dr. Tonita Cong 10/19/2013, 4:40 PM

## 2013-10-20 NOTE — Anesthesia Postprocedure Evaluation (Signed)
  Anesthesia Post-op Note  Patient: [name]  Procedure(s) Performed: Procedure(s) (LRB): MICROLUMBAR DECOMPRESSION MICRODISCECTOMY L3-4,L4-5 (N/A)  Patient Location: PACU  Anesthesia Type: General  Level of Consciousness: awake and alert   Airway and Oxygen Therapy: Patient Spontanous Breathing  Post-op Pain: mild  Post-op Assessment: Post-op Vital signs reviewed, Patient's Cardiovascular Status Stable, Respiratory Function Stable, Patent Airway and No signs of Nausea or vomiting  Last Vitals:  Filed Vitals:   10/20/13 1256  BP: 128/71  Pulse: 78  Temp: 36.9 C  Resp:     Post-op Vital Signs: stable   Complications: No apparent anesthesia complications

## 2013-10-21 ENCOUNTER — Encounter (HOSPITAL_COMMUNITY): Payer: Self-pay | Admitting: Specialist

## 2013-10-21 LAB — GLUCOSE, CAPILLARY: Glucose-Capillary: 92 mg/dL (ref 70–99)

## 2013-10-21 MED ORDER — IBUPROFEN 200 MG PO TABS: 200.0000 mg | ORAL_TABLET | Freq: Four times a day (QID) | ORAL | Status: AC | PRN

## 2013-10-21 NOTE — Evaluation (Signed)
Occupational Therapy Evaluation Patient Details Name: [name] MRN: [id number] DOB: 02/28/1941 Today's Date: 10/21/2013 Time: 2409-7353 OT Time Calculation (min): 25 min  OT Assessment / Plan / Recommendation History of present illness Extruded fragment of disk herniation, 3-4, 4-5   Clinical Impression   Pt presents to OT with decreased I with ADL activity s/p back surgery. Pt will benefit from skilled OT to increase I with ADL activity and return to PLOF   OT Assessment  Patient needs continued OT Services    Follow Up Recommendations  Home health OT       Equipment Recommendations  None recommended by OT       Frequency  Min 2X/week    Precautions / Restrictions Precautions Precautions: Back       ADL  Grooming: Set up Where Assessed - Grooming: Supported sitting Upper Body Bathing: Set up Where Assessed - Upper Body Bathing: Supported sitting Lower Body Bathing: Moderate assistance Where Assessed - Lower Body Bathing: Supported sit to stand Upper Body Dressing: Set up Where Assessed - Upper Body Dressing: Supported sitting Lower Body Dressing: Maximal assistance Where Assessed - Lower Body Dressing: Supported sit to Lobbyist: Minimal assistance Toilet Transfer Method: Sit to stand;Stand pivot Transfers/Ambulation Related to ADLs: Pt in pain. called RN for meds.  Pt did agree to up in chair. Pt has a history of quad weakness on the left so was very nervous to stand. Upon standing pts L knee did not buckle- but pt did feel like she needed to sit in chair quickly. Will see pt later in morn to address further ADL activity     OT Diagnosis: Generalized weakness  OT Problem List: Decreased strength;Decreased activity tolerance;Decreased knowledge of use of DME or AE OT Treatment Interventions: Self-care/ADL training;Patient/family education;DME and/or AE instruction   OT Goals(Current goals can be found in the care plan section) Acute Rehab OT  Goals Patient Stated Goal: home later today OT Goal Formulation: With patient Time For Goal Achievement: 11/04/13 Potential to Achieve Goals: Good  Visit Information  Last OT Received On: 10/21/13 Assistance Needed: +1 History of Present Illness: Extruded fragment of disk herniation, 3-4, 4-5       Prior [place] expects to be discharged to:: Private residence Living Arrangements: Spouse/significant other Available Help at Discharge: Family Type of Home: [place]: Two level;Able to live on main level with bedroom/bathroom Alternate Level Stairs-Number of Steps: 14 Alternate Level Stairs-Rails: Right;Left Home Equipment: Walker - 2 wheels;Cane - single point;Wheelchair - manual Prior Function Level of Independence: Independent Comments: except last week  Communication Communication: No difficulties         Vision/Perception Vision - History Patient Visual Report: No change from baseline   Cognition  Cognition Arousal/Alertness: Awake/alert Behavior During Therapy: WFL for tasks assessed/performed Overall Cognitive Status: Within Functional Limits for tasks assessed    Extremity/Trunk Assessment Upper Extremity Assessment Upper Extremity Assessment: Generalized weakness     Mobility Bed Mobility Overal bed mobility: Needs Assistance Bed Mobility: Sidelying to Sit Sidelying to sit: Min assist General bed mobility comments: verbbal cues for technique Transfers Overall transfer level: Needs assistance Equipment used: 1 person hand held assist Transfers: Sit to/from Stand Sit to Stand: Min assist           End of Session OT - End of Session Activity Tolerance: Patient tolerated treatment well Patient left: in chair;with call bell/phone within reach Nurse Communication: Mobility status  GO  Betsy Pries 10/21/2013, 9:44 AM

## 2013-10-21 NOTE — Progress Notes (Signed)
Occupational Therapy Treatment Patient Details Name: [name] MRN: [id number] DOB: 06-08-1941 Today's Date: 10/21/2013 Time: 3818-2993 OT Time Calculation (min): 11 min  OT Assessment / Plan / Recommendation  History of present illness Extruded fragment of disk herniation, 3-4, 4-5   OT comments  Pt much improved 2 nd OT session. Pt able to get dressed, walk to BR , etc  Follow Up Recommendations  Home health OT       Equipment Recommendations  None recommended by OT       Frequency Min 2X/week   Progress towards OT Goals Progress towards OT goals: Progressing toward goals  Plan Discharge plan remains appropriate    Precautions / Restrictions Precautions Precautions: Back       ADL  Grooming: Performed;Supervision/safety Where Assessed - Grooming: Unsupported standing Upper Body Bathing: Set up Where Assessed - Upper Body Bathing: Supported sitting Lower Body Bathing: Moderate assistance Where Assessed - Lower Body Bathing: Supported sit to stand Upper Body Dressing: Set up Where Assessed - Upper Body Dressing: Supported sitting Lower Body Dressing: Performed;Supervision/safety Where Assessed - Lower Body Dressing: Unsupported sit to stand Toilet Transfer: Performed;Supervision/safety Toilet Transfer Method: Sit to Loss adjuster, chartered: Regular height toilet Toileting - Clothing Manipulation and Hygiene: Performed;Supervision/safety Where Assessed - Best boy and Hygiene: Standing Tub/Shower Transfer: Supervision/safety Tub/Shower Transfer Method: Ambulating Transfers/Ambulation Related to ADLs: Pt must better for second session. Pt able to get dressed and follow all back precautions    OT Diagnosis: Generalized weakness  OT Problem List: Decreased strength;Decreased activity tolerance;Decreased knowledge of use of DME or AE OT Treatment Interventions: Self-care/ADL training;Patient/family education;DME and/or AE instruction    OT Goals(current goals can now be found in the care plan section) Acute Rehab OT Goals Patient Stated Goal: home later today OT Goal Formulation: With patient Time For Goal Achievement: 11/04/13 Potential to Achieve Goals: Good ADL Goals Pt Will Perform Grooming: with set-up;standing Pt Will Perform Upper Body Dressing: with set-up;sitting Pt Will Perform Lower Body Dressing: with set-up;sit to/from stand;with adaptive equipment Pt Will Transfer to Toilet: with supervision;ambulating;regular height toilet Pt Will Perform Toileting - Clothing Manipulation and hygiene: with supervision;sit to/from stand Pt Will Perform Tub/Shower Transfer: with set-up;ambulating  Visit Information  Last OT Received On: 10/21/13 Assistance Needed: +1 History of Present Illness: Extruded fragment of disk herniation, 3-4, 4-5       Prior [place] expects to be discharged to:: Private residence Living Arrangements: Spouse/significant other Available Help at Discharge: Family Type of Home: [place]: Two level;Able to live on main level with bedroom/bathroom Alternate Level Stairs-Number of Steps: 14 Alternate Level Stairs-Rails: Right;Left Home Equipment: Walker - 2 wheels;Cane - single point;Wheelchair - manual Prior Function Level of Independence: Independent Comments: except last week  Communication Communication: No difficulties    Cognition  Cognition Arousal/Alertness: Awake/alert Behavior During Therapy: WFL for tasks assessed/performed Overall Cognitive Status: Within Functional Limits for tasks assessed    Mobility  Bed Mobility Overal bed mobility: Needs Assistance Bed Mobility: Sidelying to Sit Sidelying to sit: Min assist General bed mobility comments: verbbal cues for technique Transfers Overall transfer level: Needs assistance Equipment used: 1 person hand held assist Transfers: Sit to/from Stand Sit to Stand: Supervision           End of Session OT - End of Session Activity Tolerance: Patient tolerated treatment well Patient left: in chair Nurse Communication: Mobility status  GO     Betsy Pries 10/21/2013, 1:13  PM

## 2013-10-21 NOTE — Op Note (Signed)
[name], [name]              ACCOUNT NO.:  [id number]  MEDICAL RECORD NO.:  [id number]  LOCATION:  2956                         FACILITY:  [facility]  PHYSICIAN:  Susa Day, M.D.    DATE OF BIRTH:  1940-10-26  DATE OF PROCEDURE:  10/20/2013 DATE OF DISCHARGE:                              OPERATIVE REPORT   PREOPERATIVE DIAGNOSIS:  Extruded fragment of disk herniation, 3-4, 4-5.  POSTOPERATIVE DIAGNOSIS:  Extruded fragment of disk herniation, 3-4, 4- 5.  PROCEDURE PERFORMED: 1. Microlumbar decompression at L3-4 and L4-5 with hemilaminectomy of     L4. 2. Retrieval of multiple free fragments of disk herniation. 3. Foraminotomies of L4-L5, left.  ANESTHESIA:  General.  ASSISTANT:  Cleophas Dunker, PA.  BRIEF HISTORY:  This is a 73 year old female who has had severe pain, mild terminal weakness, dermatomal dysesthesias in L4 nerve root distribution secondary to extruded fragment appearing to migrate either from 3-4 or 4-5 behind the vertebral body of 4.  Due to the severity of the pain and neurologic deficit and a large neurocompressive lesion, she was indicated for lumbar decompression.  Risk and benefits discussed including bleeding, infection, damage to the neurovascular structures, DVT, PE, anesthetic complications, etc.  The patient had a previously treated MRSA infection, was seen in the office, no evidence of an active infection.  However, we did discuss prophylaxis prior to the that.  Prior to the procedure, we used vancomycin.  TECHNIQUE:  With the patient in a supine position, after induction of adequate general anesthesia, 1 g of vancomycin, placed prone on the [place] frame.  All bony prominences were well padded.  Lumbar region was prepped and draped in the usual sterile fashion.  Two 18-gauge spinal needles were utilized to localize the interspace at 4-5. Incision was made from 3-4 to below 4-5.  Subcutaneous tissue was dissected.  Electrocautery was  utilized to achieve hemostasis. Dorsolumbar fascia identified, divided in line with the skin incision. Paraspinous muscle elevated from lamina of 3-4 and 4-5.  Confirmatory radiograph obtained, operating microscope was draped, brought in the surgical field.  We removed the hemi-lamina 4 on the left with a combination of [place] rongeur then 2 and 3 mm [place] preserving the pars and the disk herniation migrated cephalad behind the lamina of 4. I detached the ligamentum flavum from the cephalad edge of 5 utilizing a straight curette.  Neuro patty placed beneath the ligamentum flavum. Removed ligamentum flavum from the interspaces of 3-4 and then 4-5. Next, with protection of the thecal sac in the 5 and 4 roots, we decompressed the lateral recess to the medial border of the pedicles, performed a foraminotomy of 5 and 4.  Severe tension was noted on the root.  We gently mobilized the thecal sac medially.  We were able to identify large free fragment, which was manipulated out from underneath the nerve root 4 utilizing a nerve hook.  It was a total 6 separate fragments that were retrieved from various positions.  There was extensive epidural venous plexus and bipolar electrocautery was utilized to achieve hemostasis.  We obtained confirmatory radiograph indicating that we were at the disk space of 4-5 and above that at 3-4 and this  was the amount of material consistent with that seen on the MRI.  Following the decompression, there was 1 cm excursion of the nerve root medial to the pedicle.  Again we performed foraminotomies of 4 and 5.  We checked the disk space at 3-4 and 4-5.  There was no residual disk herniation at the 4-5 space and at 3-4 as well.  Copiously irrigated with antibiotic irrigation.  No evidence of CSF leakage or active bleeding was noted. Thrombin-soaked Gelfoam was placed in laminotomy defect.  No active bleeding.  Bone wax on the cancellous surfaces.  Removed the  [facility] retractor.  Paraspinous muscles were irrigated.  #1 Vicryl closed the fascia, subcu 2, skin staples.  Wound was dressed sterilely, placed supine on the hospital bed, extubated without difficulty, and transported to the recovery room in a satisfactory condition.  The patient tolerated the procedure well.  No complications.  Blood loss was 25 mL.     Susa Day, M.D.     Geralynn Rile  D:  10/20/2013  T:  10/21/2013  Job:  [id number]

## 2013-10-21 NOTE — Progress Notes (Signed)
Subjective: 1 Day Post-Op Procedure(s) (LRB): MICROLUMBAR DECOMPRESSION MICRODISCECTOMY L3-4,L4-5 (N/A) Patient reports pain as mild.  Reports her left leg pain is resolved. She does still note some decreased sensation in the left leg. Has not yet been OOB. Foley removed this AM. Does note incisional back soreness. No other c/o.  Objective: Vital signs in last 24 hours: Temp:  [97.6 F (36.4 C)-98.5 F (36.9 C)] 98.1 F (36.7 C) (01/15 0445) Pulse Rate:  [67-81] 70 (01/15 0445) Resp:  [14-16] 16 (01/15 0445) BP: (99-128)/(66-74) 99/66 mmHg (01/15 0445) SpO2:  [96 %-100 %] 97 % (01/15 0445) Weight:  [74.844 kg (165 lb)] 74.844 kg (165 lb) (01/14 0812)  Intake/Output from previous day: 01/14 0701 - 01/15 0700 In: 2975 [I.V.:1475] Out: 1830 [Urine:1780; Blood:50] Intake/Output this shift:     Recent Labs  10/20/13 0910  HGB 13.7    Recent Labs  10/20/13 0910  WBC 12.6*  RBC 4.23  HCT 39.4  PLT 329    Recent Labs  10/20/13 0910  NA 130*  K 4.7  CL 93*  CO2 27  BUN 18  CREATININE 1.00  GLUCOSE 96  CALCIUM 8.9   No results found for this basename: LABPT, INR,  in the last 72 hours  Neurologically intact ABD soft Neurovascular intact Sensation intact distally Intact pulses distally Dorsiflexion/Plantar flexion intact Incision: dressing C/D/I and no drainage No cellulitis present Compartment soft no calf pain  Assessment/Plan: 1 Day Post-Op Procedure(s) (LRB): MICROLUMBAR DECOMPRESSION MICRODISCECTOMY L3-4,L4-5 (N/A) Advance diet Up with therapy D/C IV fluids Discussed D/C instructions, dressing instructions, Lspine precautions To get up with PT this AM Discussed possible need for L knee immobilizer if ongoing severe quad weakness to avoid buckling Plan for D/C later today as long as pain well controlled and voiding without difficulty Discussed with Dr. Mliss Fritz, Conley Rolls. 10/21/2013, 7:45 AM

## 2013-10-21 NOTE — Evaluation (Signed)
Physical Therapy Evaluation Patient Details Name: [name] MRN: [id number] DOB: [birth year] Today's Date: 10/21/2013 Time: 1140-1156 PT Time Calculation (min): 16 min  PT Assessment / Plan / Recommendation History of Present Illness  Extruded fragment of disk herniation, 3-4, 4-5  Clinical Impression  Pt will benefit from continued HHPT    PT Assessment  All further PT needs can be met in the next venue of care    Follow Up Recommendations  Home health PT    Does the patient have the potential to tolerate intense rehabilitation      Barriers to Discharge        Equipment Recommendations       Recommendations for Other Services     Frequency      Precautions / Restrictions Precautions Precautions: Back   Pertinent Vitals/Pain No c/o pain     Mobility  Transfers Overall transfer level: Needs assistance Equipment used: Rolling walker (2 wheeled) Transfers: Sit to/from Stand Sit to Stand: Supervision General transfer comment: cues for back precautions Ambulation/Gait Ambulation/Gait assistance: Supervision;Min guard Ambulation Distance (Feet): 80 Feet Assistive device: Rolling walker (2 wheeled) Gait Pattern/deviations: Step-to pattern;Step-through pattern Stairs: Yes Stairs assistance: Min guard Stair Management: One rail Left;Step to pattern;Forwards Number of Stairs: 6 General stair comments: cues for technique    Exercises     PT Diagnosis:    PT Problem List:   PT Treatment Interventions:       PT Goals(Current goals can be found in the care plan section) Acute Rehab PT Goals Patient Stated Goal: home later today PT Goal Formulation: No goals set, d/c therapy  Visit Information  Last PT Received On: 10/21/13 Assistance Needed: +1 History of Present Illness: Extruded fragment of disk herniation, 3-4, 4-5       Prior [place] expects to be discharged to:: Private residence Living Arrangements:  Spouse/significant other Available Help at Discharge: Family Type of Home: [place]: Two level;Able to live on main level with bedroom/bathroom Alternate Level Stairs-Number of Steps: 14 Alternate Level Stairs-Rails: Right;Left Home Equipment: Walker - 2 wheels;Cane - single point;Wheelchair - manual Prior Function Level of Independence: Independent Comments: except last week  Communication Communication: No difficulties    Cognition  Cognition Arousal/Alertness: Awake/alert Behavior During Therapy: WFL for tasks assessed/performed Overall Cognitive Status: Within Functional Limits for tasks assessed    Extremity/Trunk Assessment Upper Extremity Assessment Upper Extremity Assessment: Defer to OT evaluation Lower Extremity Assessment Lower Extremity Assessment: Overall WFL for tasks assessed;LLE deficits/detail LLE Deficits / Details: LLE weaker at baseline but at least 3+5 today   Balance    End of Session PT - End of Session Equipment Utilized During Treatment: Gait belt Activity Tolerance: Patient tolerated treatment well Patient left: in chair;with call bell/phone within reach;with family/visitor present Nurse Communication: Mobility status  GP Functional Assessment Tool Used: clinical judgement Functional Limitation: Mobility: Walking and moving around Mobility: Walking and Moving Around Current Status [phone]): At least 1 percent but less than 20 percent impaired, limited or restricted Mobility: Walking and Moving Around Goal Status [phone]): At least 1 percent but less than 20 percent impaired, limited or restricted Mobility: Walking and Moving Around Discharge Status [phone]): At least 1 percent but less than 20 percent impaired, limited or restricted   [facility] 10/21/2013, 1:33 PM

## 2013-10-21 NOTE — Progress Notes (Signed)
Pt stable, scripts, d/c instructions given with no questions/concerns voiced by pt or husband.  Pt transported via wheelchair to private vehicle by NT and husband.

## 2013-10-21 NOTE — Discharge Summary (Signed)
Physician Discharge Summary   Patient ID: [name] MRN: [id number] DOB/AGE: 11/25/1940 73 y.o.  Admit date: 10/20/2013 Discharge date: 10/21/2013  Primary Diagnosis:   HNP L3-4  Admission Diagnoses:  Past Medical History  Diagnosis Date  . Allergy     cat and dog dander and seasonal  . Arthritis   . Cancer 1985    melanoma on left foot   Discharge Diagnoses:   Principal Problem:   Spinal stenosis, lumbar region, with neurogenic claudication Active Problems:   HNP (herniated nucleus pulposus), lumbar  Procedure:  Procedure(s) (LRB): MICROLUMBAR DECOMPRESSION MICRODISCECTOMY L3-4,L4-5 (N/A)   Consults: None  HPI:  see H&P    Laboratory Data: No results found for any previous visit.  Recent Labs  10/20/13 0910  HGB 13.7    Recent Labs  10/20/13 0910  WBC 12.6*  RBC 4.23  HCT 39.4  PLT 329    Recent Labs  10/20/13 0910  NA 130*  K 4.7  CL 93*  CO2 27  BUN 18  CREATININE 1.00  GLUCOSE 96  CALCIUM 8.9   No results found for this basename: LABPT, INR,  in the last 72 hours  X-Rays:Dg Lumbar Spine 2-3 Views  10/20/2013   CLINICAL DATA:  Preop evaluation for low back surgery, superior low back, left hip and leg pain, plans lumbar decompression L3-L4 and L4-L5, disc herniation  EXAM: LUMBAR SPINE - 2-3 VIEW  COMPARISON:  10/12/2013  FINDINGS: Bones demineralized.  Transitional vertebra at lumbosacral junction labeled on the previous and current exams as S1.  No prior cross-sectional imaging for correlation.  Scattered disc space narrowing throughout the lumbar region.  Tiny endplate spurs at scattered levels.  Vertebral body heights maintained without fracture or subluxation.  Facet degenerative changes lower lumbar spine.  SI joints symmetric.  18 x 17 mm rounded calcification in right upper quadrant likely a calcified gallstone.  Five additional non-rib-bearing lumbar type vertebrae.l  IMPRESSION: Transitional partially lumbarized S1 segment lumbar  spine as above; correlation with any prior cross-sectional imaging recommended for consistency of numbering between exams.  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  Probable cholelithiasis.   Electronically Signed   By: Lavonia Dana M.D.   On: 10/20/2013 10:05   Dg Spine Portable 1 View  10/20/2013   CLINICAL DATA:  Lumbar spine surgery at L3-L4 and L4-L5.  EXAM: PORTABLE SPINE - 1 VIEW  COMPARISON:  Portable cross-table lateral view 1227 hr compared earlier study of 08/1939 hr  FINDINGS: Image #3: Metallic probes via posterior approach project with the tips dorsal to the mid L3 and mid L4 levels.  IMPRESSION: Posterior localization of the mid L3 and mid L4 levels.   Electronically Signed   By: Lavonia Dana M.D.   On: 10/20/2013 12:47   Dg Spine Portable 1 View  10/20/2013   CLINICAL DATA:  73 year old female undergoing lumbar surgery. Initial encounter.  EXAM: PORTABLE SPINE - 1 VIEW  COMPARISON:  1129 hr the same day and earlier.  FINDINGS: Same numbering system, designating severe L5-S1 disc space loss.  Intraoperative portable cross-table lateral view of the lumbar spine. 1140 hrs.  Surgical probes now surround the L3-L4 facet level.  IMPRESSION: Intraoperative localization as above.   Electronically Signed   By: Lars Pinks M.D.   On: 10/20/2013 11:51   Dg Spine Portable 1 View  10/20/2013   CLINICAL DATA:  73 year old female undergoing spine surgery. Initial encounter.  EXAM: PORTABLE SPINE - 1 VIEW  COMPARISON:  0940 hr the same day.  FINDINGS: Same numbering system used as on the comparison.  Intraoperative portable cross-table lateral view of the lumbar spine at 1129 hrs.  Cephalad needle directed at the L5 spinous process. Caudal needle directed at the S1 level.  IMPRESSION: Intraoperative localization as above.   Electronically Signed   By: Lars Pinks M.D.   On: 10/20/2013 11:49    EKG:No orders found for this or any previous visit.   Hospital Course: Patient was  admitted to [facility] and taken to the OR and underwent the above state procedure without complications.  Patient tolerated the procedure well and was later transferred to the recovery room and then to the orthopaedic floor for postoperative care.  They were given PO and IV analgesics for pain control following their surgery.  They were given 24 hours of postoperative antibiotics.   PT was consulted postop to assist with mobility and transfers.  The patient was allowed to be WBAT with therapy and was taught back precautions. Discharge planning was consulted to help with postop disposition and equipment needs.  Patient had a good night on the evening of surgery and started to get up OOB with therapy on day one. Patient was seen in rounds and was ready to go home on day one.  They were given discharge instructions and dressing directions.  They were instructed on when to follow up in the office with Dr. Tonita Cong.  Discharge Medications: Prior to Admission medications   Medication Sig Start Date End Date Taking? Authorizing Provider  cetirizine (ZYRTEC) 10 MG tablet Take 10 mg by mouth as needed.    Yes Historical Provider, MD  cyclobenzaprine (FLEXERIL) 10 MG tablet Take 10 mg by mouth 3 (three) times daily as needed for muscle spasms.   Yes Historical Provider, MD  HYDROcodone-acetaminophen (NORCO/VICODIN) 5-325 MG per tablet Take 1 tablet by mouth every 6 (six) hours as needed for moderate pain.   Yes Historical Provider, MD  meloxicam (MOBIC) 7.5 MG tablet Take 1 tablet by mouth Daily. Patient takes in am 04/02/12  Yes Historical Provider, MD  mupirocin ointment (BACTROBAN) 2 % Place 1 application into the nose 2 (two) times daily. As needed per patient   Yes Historical Provider, MD  predniSONE (DELTASONE) 10 MG tablet Take 10 mg by mouth daily with breakfast. Per office visit note dated 10/12/13 dose is as follows: 30mg  two times daily for 3 days, 20 mg daily two times daily for 3 days, 20 mg daily  for 3 days, 10 mg daily for 3 days 10/13/13  Yes Historical Provider, MD  PREMPRO 0.3-1.5 MG per tablet Take 1 tablet by mouth Daily. Patient takes in am 03/11/12  Yes Historical Provider, MD  sulfamethoxazole-trimethoprim (BACTRIM,SEPTRA) 400-80 MG per tablet Take 1 tablet by mouth 2 (two) times daily.   Yes Historical Provider, MD  docusate sodium (COLACE) 100 MG capsule Take 1 capsule (100 mg total) by mouth 2 (two) times daily. 10/20/13   Johnn Hai, MD  HYDROcodone-acetaminophen (NORCO) 7.5-325 MG per tablet Take 1-2 tablets by mouth every 4 (four) hours as needed for moderate pain. 10/20/13   Johnn Hai, MD  ibuprofen (ADVIL,MOTRIN) 200 MG tablet Take 1 tablet (200 mg total) by mouth every 6 (six) hours as needed. As needed per patient 10/21/13   Conley Rolls. Rye Dorado, PA-C  methocarbamol (ROBAXIN) 500 MG tablet Take 1 tablet (500 mg total) by mouth 3 (three) times daily. 10/20/13   Doroteo Bradford  Beane, MD    Diet: Regular diet Activity:WBAT; Lspine precautions Follow-up:in 10-41 days Disposition - Home Discharged Condition: good   Discharge Orders   Future Orders Complete By Expires   Call MD / Call 911  As directed    Comments:     If you experience chest pain or shortness of breath, CALL 911 and be transported to the hospital emergency room.  If you develope a fever above 101 F, pus (white drainage) or increased drainage or redness at the wound, or calf pain, call your surgeon's office.   Constipation Prevention  As directed    Comments:     Drink plenty of fluids.  Prune juice may be helpful.  You may use a stool softener, such as Colace (over the counter) 100 mg twice a day.  Use MiraLax (over the counter) for constipation as needed.   Diet - low sodium heart healthy  As directed    Increase activity slowly as tolerated  As directed        Medication List    STOP taking these medications       BACTRIM PO     cyclobenzaprine 10 MG tablet  Commonly known as:  FLEXERIL      HYDROcodone-acetaminophen 5-325 MG per tablet  Commonly known as:  NORCO/VICODIN  Replaced by:  HYDROcodone-acetaminophen 7.5-325 MG per tablet     predniSONE 10 MG tablet  Commonly known as:  DELTASONE     sulfamethoxazole-trimethoprim 400-80 MG per tablet  Commonly known as:  BACTRIM,SEPTRA      TAKE these medications       cetirizine 10 MG tablet  Commonly known as:  ZYRTEC  Take 10 mg by mouth as needed.     docusate sodium 100 MG capsule  Commonly known as:  COLACE  Take 1 capsule (100 mg total) by mouth 2 (two) times daily.     HYDROcodone-acetaminophen 7.5-325 MG per tablet  Commonly known as:  NORCO  Take 1-2 tablets by mouth every 4 (four) hours as needed for moderate pain.     ibuprofen 200 MG tablet  Commonly known as:  ADVIL,MOTRIN  Take 1 tablet (200 mg total) by mouth every 6 (six) hours as needed. As needed per patient     meloxicam 7.5 MG tablet  Commonly known as:  MOBIC  Take 1 tablet by mouth Daily. Patient takes in am     methocarbamol 500 MG tablet  Commonly known as:  ROBAXIN  Take 1 tablet (500 mg total) by mouth 3 (three) times daily.     mupirocin ointment 2 %  Commonly known as:  BACTROBAN  Place 1 application into the nose 2 (two) times daily. As needed per patient     PREMPRO 0.3-1.5 MG per tablet  Generic drug:  estrogen (conjugated)-medroxyprogesterone  Take 1 tablet by mouth Daily. Patient takes in am           Follow-up Information   Follow up with BEANE,JEFFREY C, MD In 2 weeks.   Specialty:  Orthopedic Surgery   Contact information:   [address] [place] [address] [phone]       Signed: Cecilie Kicks. 10/21/2013, 10:38 AM

## 2013-10-22 NOTE — Care Management Note (Signed)
    Page 1 of 1   10/22/2013     12:48:33 PM   CARE MANAGEMENT NOTE 10/22/2013  Patient:  [name], [name]   Account Number:  [id number]  Date Initiated:  10/21/2013  Documentation initiated by:  Sherrin Daisy  Subjective/Objective Assessment:   DX HNP; MICROLUMBAR DECOMPRESSION, HEMILAMINECTOMY     Action/Plan:   PLANS HOME WITH UPON DISCHARGE. lLIVES IN [place] WITH SPOUSE. HAS RW IF NEEDED. NO HH NEEDS   Anticipated DC Date:  10/21/2013   Anticipated DC Plan:  HOME/SELF CARE      DC Planning Services  CM consult      Choice offered to / List presented to:             Status of service:  Completed, signed off Medicare Important Message given?   (If response is "NO", the following Medicare IM given date fields will be blank) Date Medicare IM given:   Date Additional Medicare IM given:    Discharge Disposition:  HOME/SELF CARE  Per UR Regulation:    If discussed at Long Length of Stay Meetings, dates discussed:    Comments:

## 2013-10-27 NOTE — Progress Notes (Signed)
10/21/13 0944  OT G-codes **NOT FOR INPATIENT CLASS**  Functional Assessment Tool Used (clinical observation)  Functional Limitation Self care  Self Care Current Status (P6195) CK  Self Care Goal Status (K9326) CI  Kari Baars, [place]

## 2014-11-22 IMAGING — CR DG LUMBAR SPINE 2-3V
2 series · 2 of 2 positions shown · non-contrast
Comparison: 10/12/2013

CLINICAL DATA: Preop evaluation for low back surgery, superior low
back, left hip and leg pain, plans lumbar decompression L3-L4 and
L4-L5, disc herniation

EXAM:
LUMBAR SPINE - 2-3 VIEW

[t l-spine a.p.]
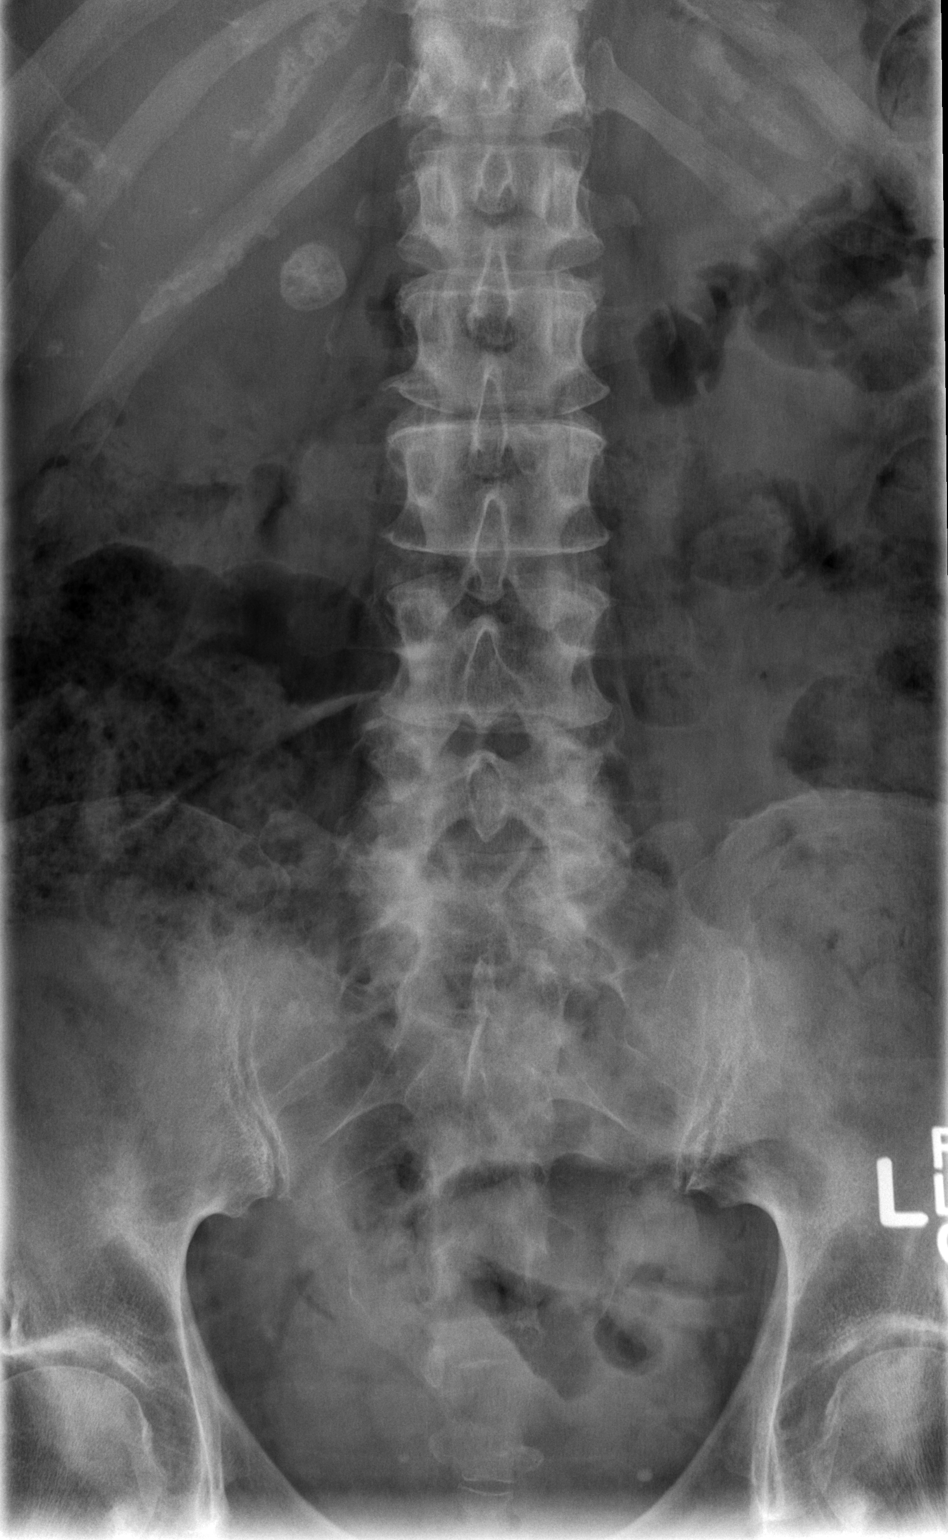

[t l-spine lat]
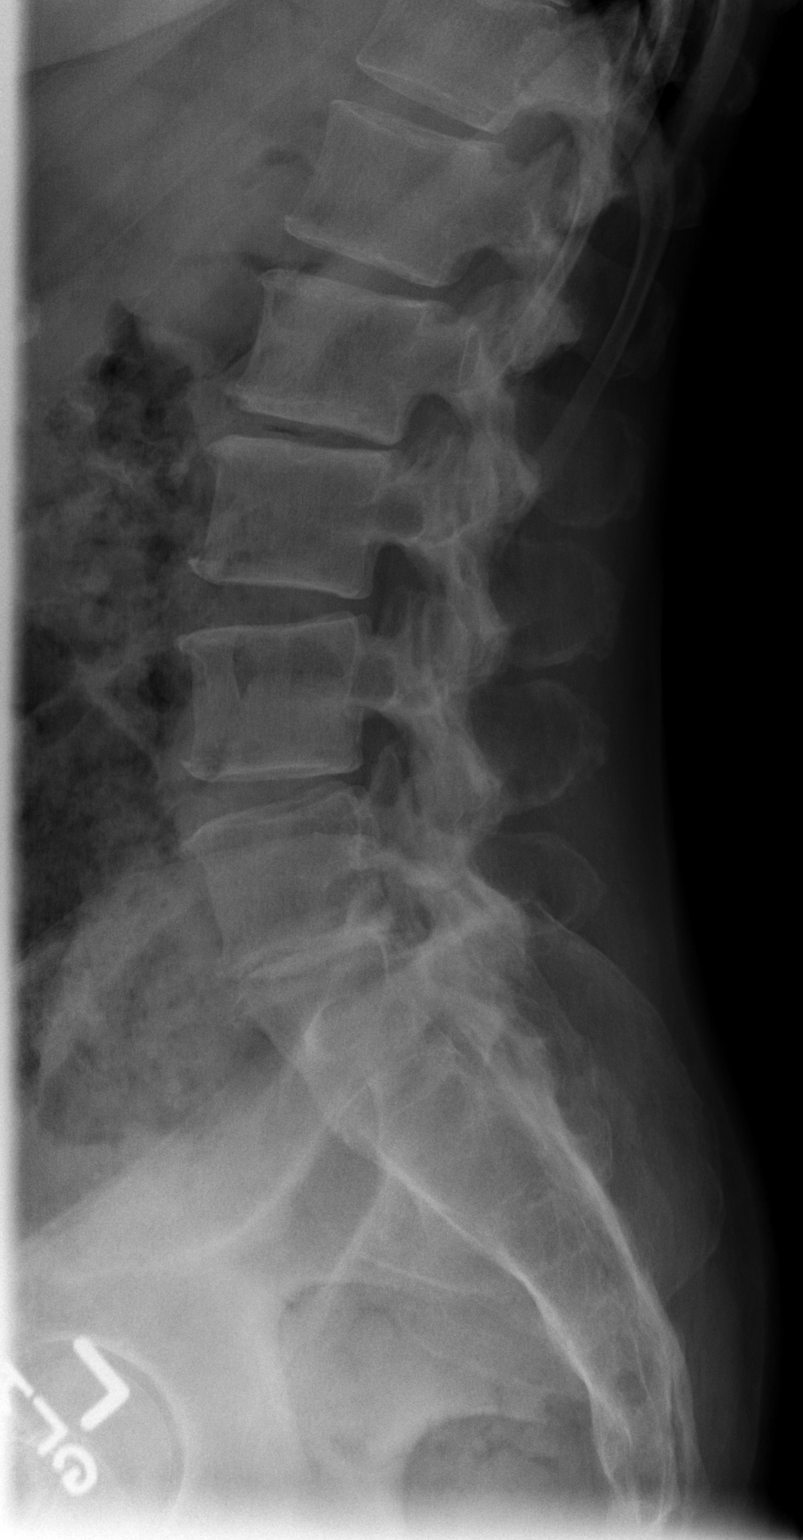

[2 of 2 positions shown; findings below may reference images not displayed]

FINDINGS: Bones demineralized.

Transitional vertebra at lumbosacral junction labeled on the
previous and current exams as S1.

No prior cross-sectional imaging for correlation.

Scattered disc space narrowing throughout the lumbar region.

Tiny endplate spurs at scattered levels.

Vertebral body heights maintained without fracture or subluxation.

Facet degenerative changes lower lumbar spine.

SI joints symmetric.

18 x 17 mm rounded calcification in right upper quadrant likely a
calcified gallstone.

Five additional non-rib-bearing lumbar type vertebrae.l
IMPRESSION: Transitional partially lumbarized S1 segment lumbar spine as above;
correlation with any prior cross-sectional imaging recommended for
consistency of numbering between exams.

Scattered degenerative disc and facet disease changes lumbar spine.

Probable cholelithiasis.

## 2014-11-22 IMAGING — CR DG SPINE 1V PORT
1 series · 1 of 1 positions shown · non-contrast
Comparison: 3350 hr the same day and earlier.

CLINICAL DATA: 72-year-old female undergoing lumbar surgery.
Initial encounter.

EXAM:
PORTABLE SPINE - 1 VIEW

[lateral]
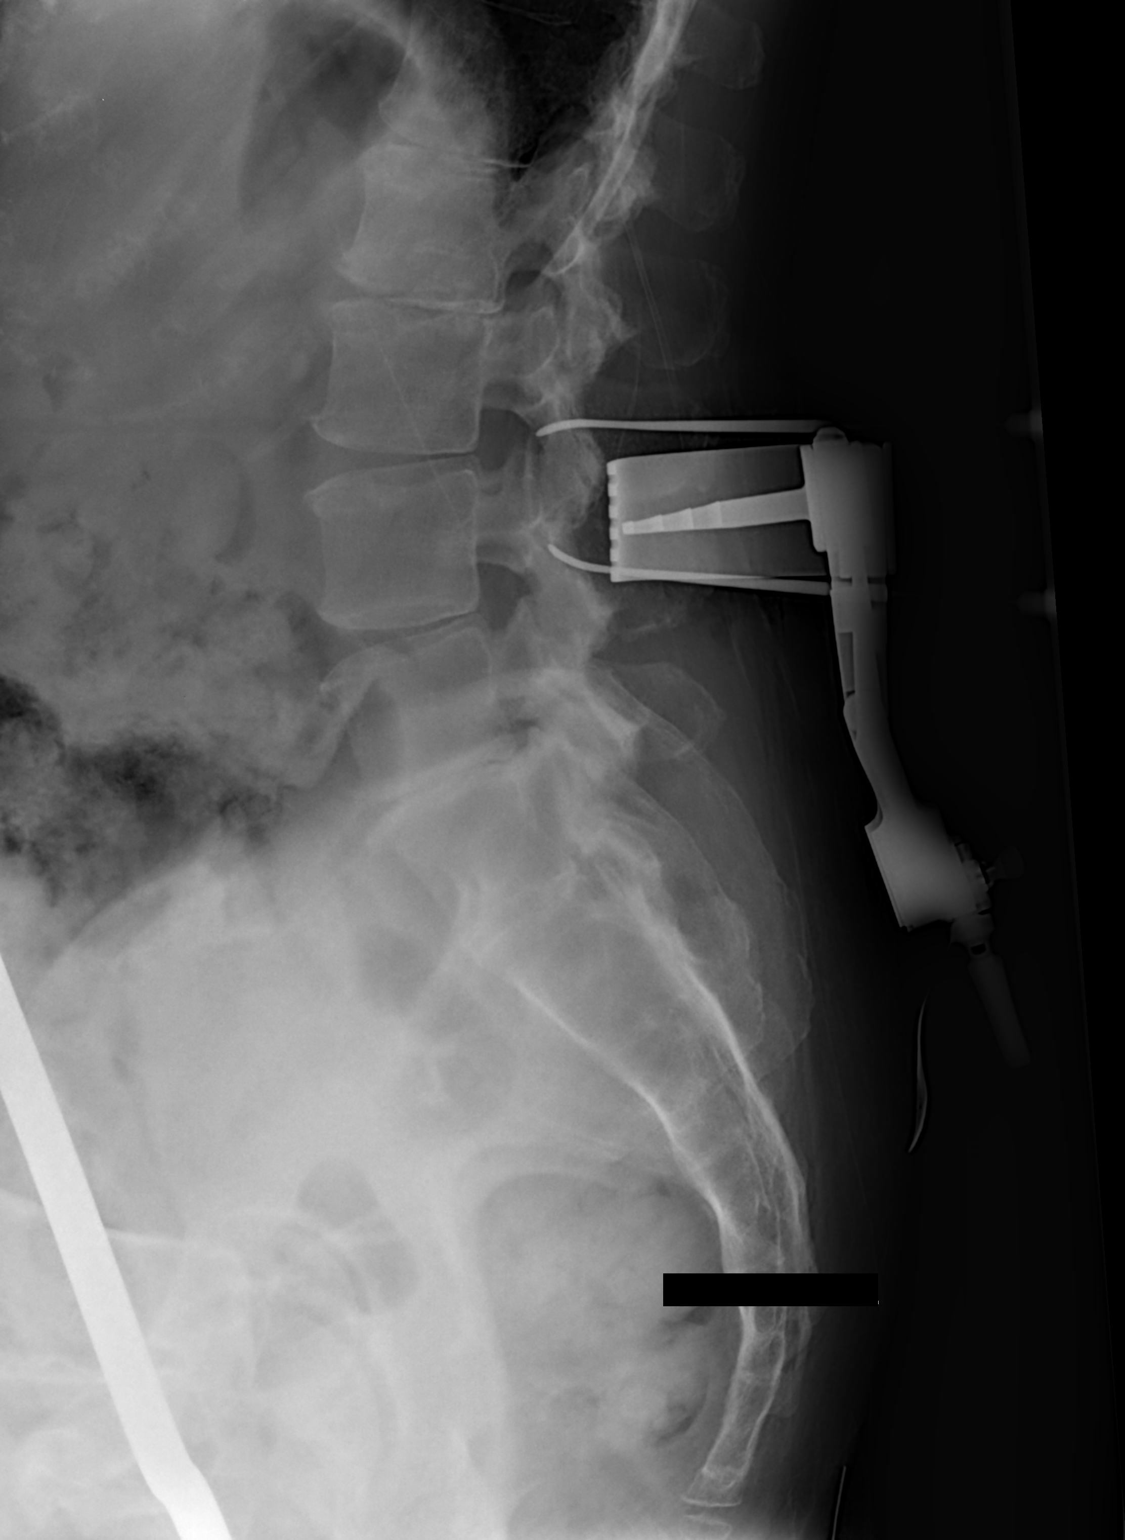

[1 of 1 positions shown; findings below may reference images not displayed]

FINDINGS: Same numbering system, designating severe L5-S1 disc space loss.

Intraoperative portable cross-table lateral view of the lumbar
spine. 6621 hrs.

Surgical probes now surround the L3-L4 facet level.
IMPRESSION: Intraoperative localization as above.

## 2022-06-19 ENCOUNTER — Encounter: Payer: Self-pay | Admitting: Internal Medicine

## 2024-05-11 ENCOUNTER — Other Ambulatory Visit: Payer: Self-pay | Admitting: *Deleted

## 2024-05-11 DIAGNOSIS — R928 Other abnormal and inconclusive findings on diagnostic imaging of breast: Secondary | ICD-10-CM

## 2024-05-24 ENCOUNTER — Ambulatory Visit
Admission: RE | Admit: 2024-05-24 | Discharge: 2024-05-24 | Disposition: A | Discharge status: Home / Self Care | Payer: Self-pay | Source: Ambulatory Visit | Attending: *Deleted | Admitting: *Deleted

## 2024-05-24 DIAGNOSIS — R928 Other abnormal and inconclusive findings on diagnostic imaging of breast: Secondary | ICD-10-CM

## 2024-05-24 MED ORDER — GADOPICLENOL 0.5 MMOL/ML IV SOLN
5.0000 mL | Freq: Once | INTRAVENOUS | Status: AC | PRN
  Administered 2024-05-24: 5 mL via INTRAVENOUS

## 2024-05-25 LAB — SURGICAL PATHOLOGY
